# Patient Record
Sex: Male | Born: 2000 | Race: White | Hispanic: No | Marital: Single | Smoking: Never smoker
Health system: Southern US, Community
[De-identification: ages and names within clinical notes are randomized; demographics above are authoritative.]

---
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
## 2019-05-22 ENCOUNTER — Other Ambulatory Visit: Payer: Self-pay

## 2019-05-22 DIAGNOSIS — Z20822 Contact with and (suspected) exposure to covid-19: Secondary | ICD-10-CM

## 2019-05-24 LAB — NOVEL CORONAVIRUS, NAA: SARS-CoV-2, NAA: NOT DETECTED

## 2019-12-01 ENCOUNTER — Ambulatory Visit: Payer: PRIVATE HEALTH INSURANCE | Attending: Internal Medicine

## 2019-12-01 DIAGNOSIS — Z23 Encounter for immunization: Secondary | ICD-10-CM

## 2019-12-01 NOTE — Progress Notes (Signed)
   Covid-19 Vaccination Clinic  Name:  Jared Carson    MRN: [id number] DOB: 02/06/2001  12/01/2019  Jared Carson was observed post Covid-19 immunization for 15 minutes without incident. He was provided with Vaccine Information Sheet and instruction to access the V-Safe system.   Jared Carson was instructed to call 911 with any severe reactions post vaccine: Marland Kitchen Difficulty breathing  . Swelling of face and throat  . A fast heartbeat  . A bad rash all over body  . Dizziness and weakness   Immunizations Administered    Name Date Dose VIS Date Route   Pfizer COVID-19 Vaccine 12/01/2019  1:14 PM 0.3 mL 08/11/2019 Intramuscular   Manufacturer: [employer], [employer]   Lot: [id number]   NDC: [address]-2

## 2019-12-05 ENCOUNTER — Ambulatory Visit: Payer: PRIVATE HEALTH INSURANCE | Admitting: Family

## 2019-12-05 DIAGNOSIS — S90421S Blister (nonthermal), right great toe, sequela: Secondary | ICD-10-CM

## 2019-12-05 NOTE — Progress Notes (Signed)
   Acute Office Visit  Subjective:    Patient ID: Jared Carson, male    DOB: [birth year], 19 y.o.   MRN: [id number]  Chief Complaint  Patient presents with  . Blister    HPI Patient is in today with complaint of foot pain, blisters and an infected toenail since being forced to wear new Under Armor cleats during soccer.  Pt was evaluated in student health for an infection of his right great toe.  He took antibiotics and the infection finally cleared.  Pt admits that he has had to change the way he runs to help decrease the pain.  He has pain at the back of his foot where the cleat rubs his achilles as well as numerous pains among his toes. His previous [employer] pair of cleats did not cause any pain.  No past medical history on file.  No past surgical history on file.  No Known Allergies  Review of Systems  Skin: Positive for wound.       Objective:    Physical Exam Constitutional:      Appearance: Normal appearance.  Skin:    Findings: No wound.     Comments: Disperse erythematous and calloused areas to bilateral feet and post feet/heels.  No active infections or new wounds.  Neurological:     Mental Status: He is alert.     There were no vitals taken for this visit.     Assessment & Plan:   Reviewed exam findings with pt.  Will document medical necessity for pt to wear [employer] cleats to avoid further injury.  Will email this document to pt on letter head.  Pt agrees with plan.  No orders of the defined types were placed in this encounter.    Larena Ohnemus, Deirdre Peer, NP

## 2019-12-22 ENCOUNTER — Ambulatory Visit: Payer: PRIVATE HEALTH INSURANCE | Attending: Internal Medicine

## 2019-12-22 DIAGNOSIS — Z23 Encounter for immunization: Secondary | ICD-10-CM

## 2019-12-22 NOTE — Progress Notes (Signed)
   Covid-19 Vaccination Clinic  Name:  Tomoya Ringwald    MRN: [id number] DOB: 02/15/2001  12/22/2019  Mr. Terrio was observed post Covid-19 immunization for 15 minutes without incident. He was provided with Vaccine Information Sheet and instruction to access the V-Safe system.   Mr. Deskins was instructed to call 911 with any severe reactions post vaccine: Marland Kitchen Difficulty breathing  . Swelling of face and throat  . A fast heartbeat  . A bad rash all over body  . Dizziness and weakness   Immunizations Administered    Name Date Dose VIS Date Route   Pfizer COVID-19 Vaccine 12/22/2019 12:59 PM 0.3 mL 10/25/2018 Intramuscular   Manufacturer: [employer], [employer]   Lot: [id number]   NDC: [address]-1

## 2020-05-20 ENCOUNTER — Other Ambulatory Visit: Payer: Self-pay

## 2020-05-20 ENCOUNTER — Ambulatory Visit
Admission: RE | Admit: 2020-05-20 | Discharge: 2020-05-20 | Disposition: A | Discharge status: Home / Self Care | Payer: PRIVATE HEALTH INSURANCE | Source: Ambulatory Visit | Attending: Sports Medicine | Admitting: Sports Medicine

## 2020-05-20 ENCOUNTER — Ambulatory Visit
Admission: RE | Admit: 2020-05-20 | Discharge: 2020-05-20 | Disposition: A | Discharge status: Home / Self Care | Payer: PRIVATE HEALTH INSURANCE | Attending: Sports Medicine | Admitting: Sports Medicine

## 2020-05-20 ENCOUNTER — Other Ambulatory Visit: Payer: Self-pay | Admitting: Sports Medicine

## 2020-05-20 DIAGNOSIS — R52 Pain, unspecified: Secondary | ICD-10-CM

## 2020-05-20 DIAGNOSIS — M25559 Pain in unspecified hip: Secondary | ICD-10-CM | POA: Diagnosis present

## 2021-08-14 IMAGING — CR DG HIP (WITH OR WITHOUT PELVIS) 1V*R*
1 series · 2 of 2 positions shown · non-contrast
Comparison: None.

CLINICAL DATA: Groin pain

EXAM:
DG HIP (WITH OR WITHOUT PELVIS) 1V*L*; DG HIP (WITH OR WITHOUT
PELVIS) 1V RIGHT

[Series 1: dg hip unilat w or w/o pelvis 1v right · non-contrast · 0.14mm/px · 2 of 2 slices shown]
[im 1/2]
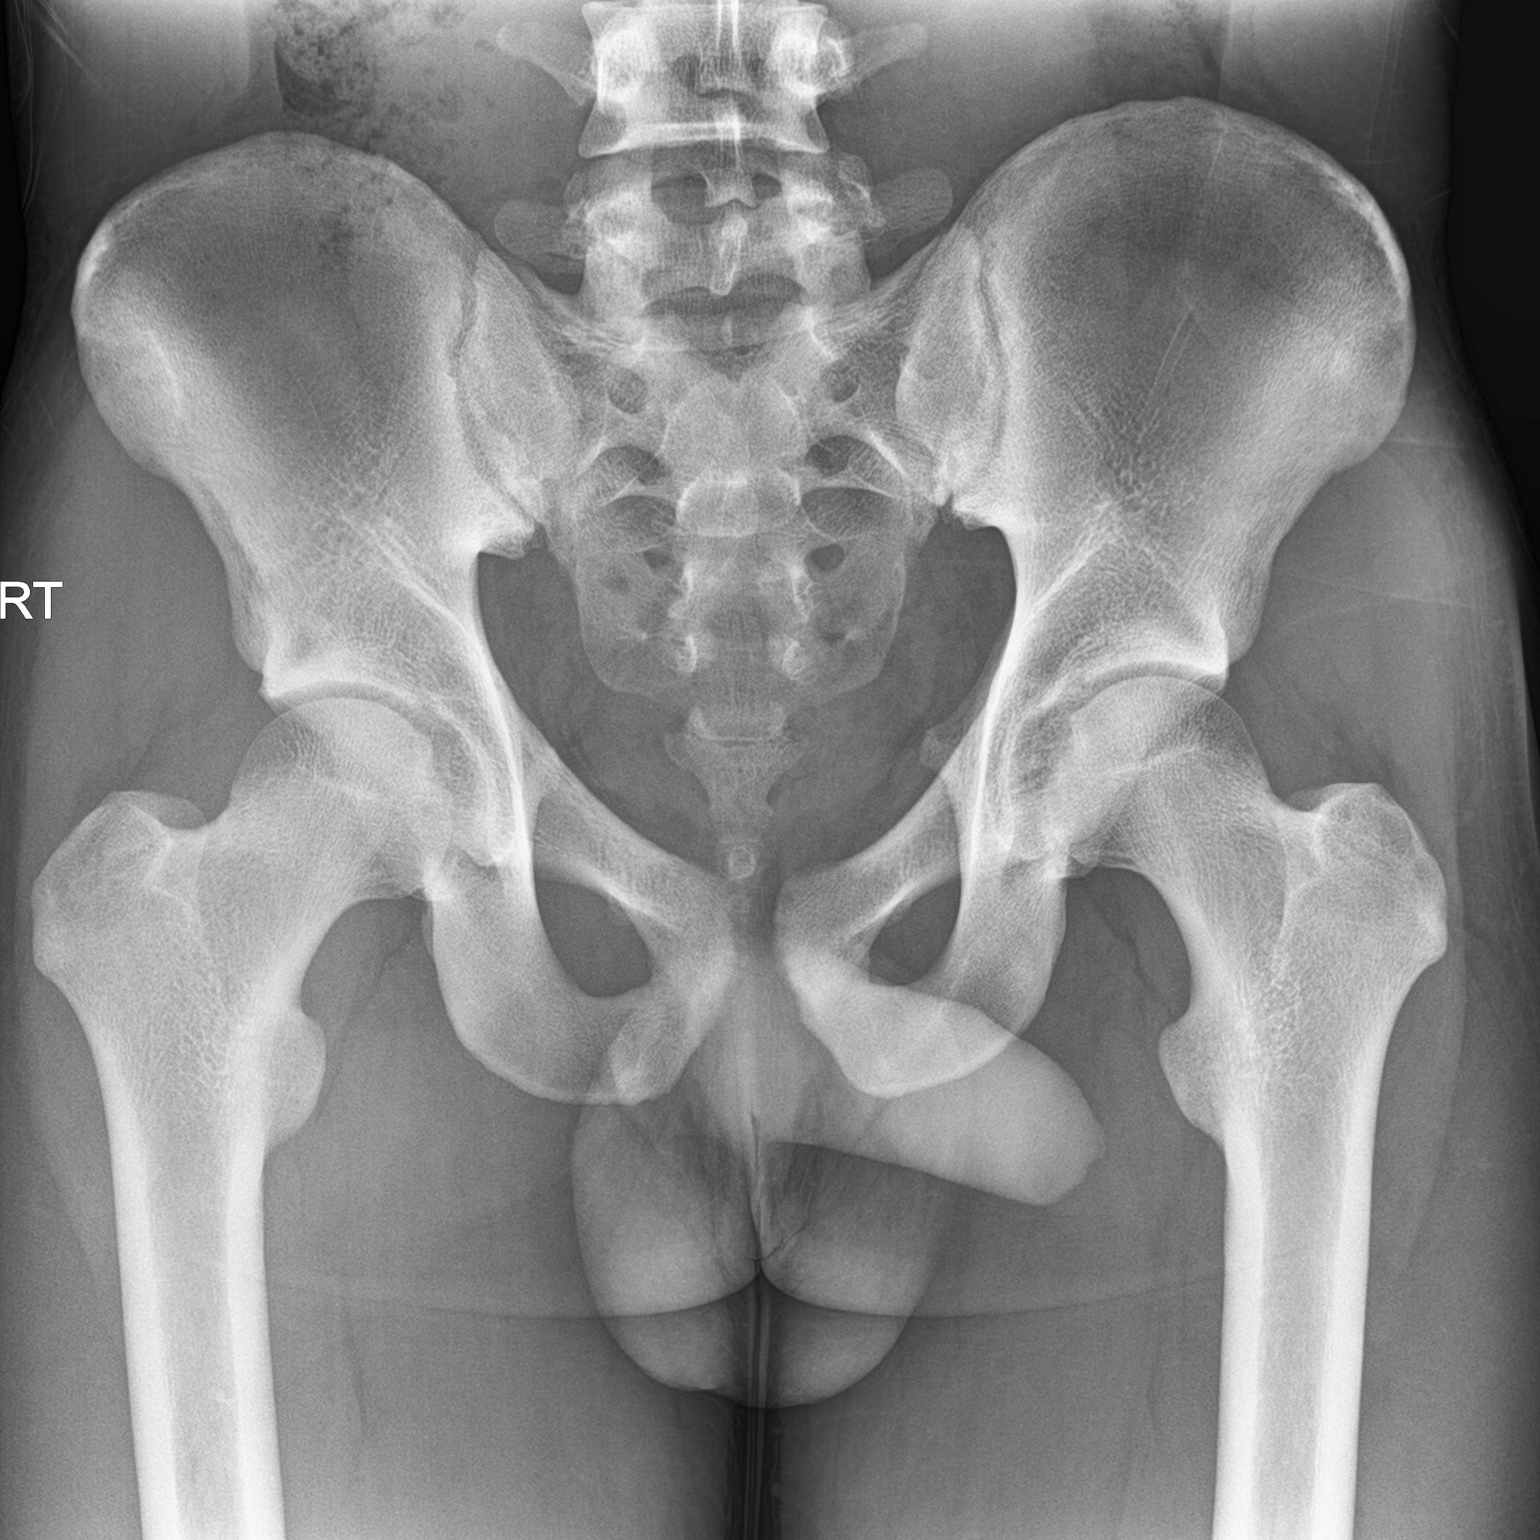
[im 2/2]
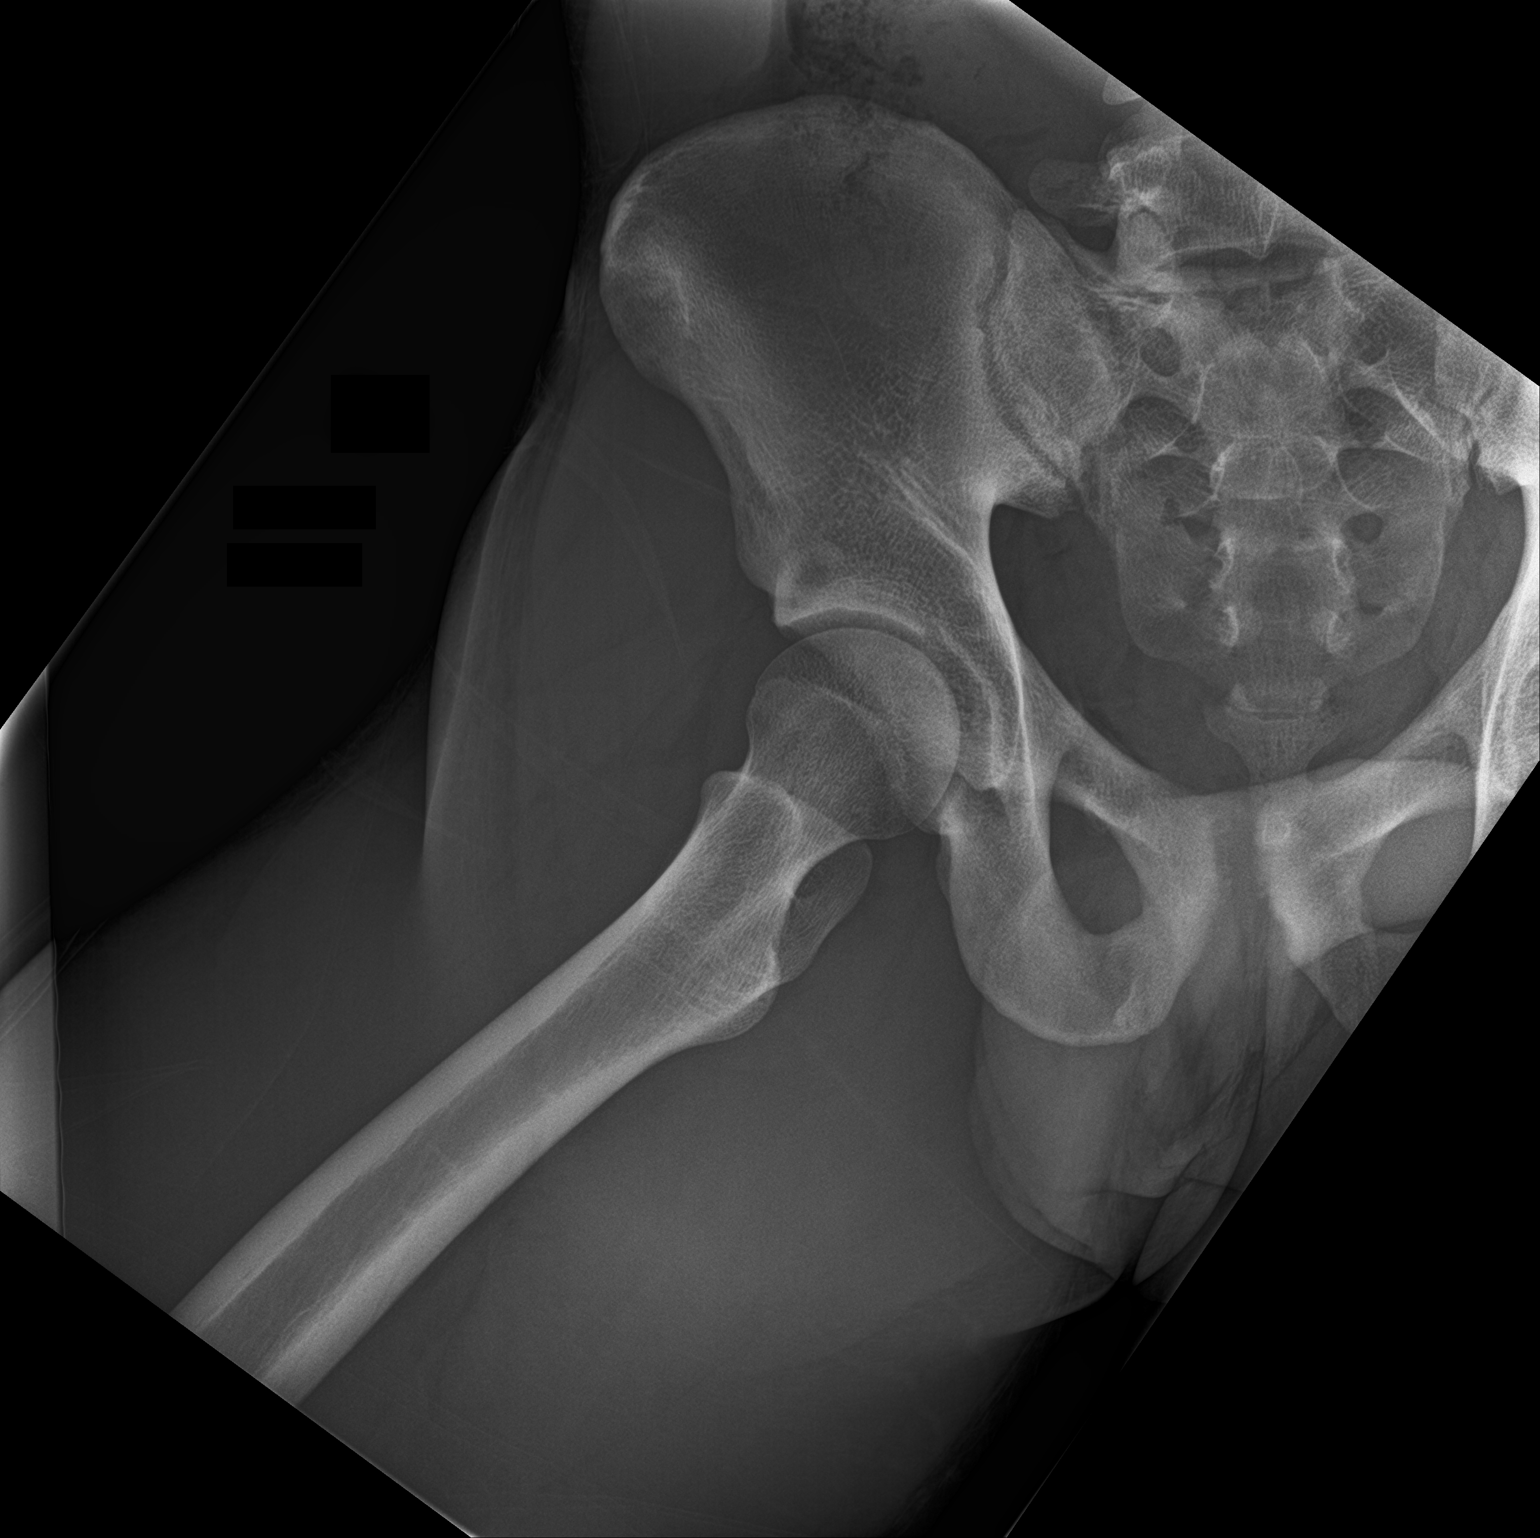

[2 of 2 positions shown; findings below may reference images not displayed]

FINDINGS: There is no evidence of hip fracture or dislocation. There is no
evidence of arthropathy or other focal bone abnormality.
IMPRESSION: Negative.
